# Patient Record
Sex: Male | Born: 1965 | Race: White | Hispanic: No | Smoking: Current every day smoker
Health system: Southern US, Community
[De-identification: ages and names within clinical notes are randomized; demographics above are authoritative.]

## PROBLEM LIST (undated history)

## (undated) DIAGNOSIS — L309 Dermatitis, unspecified: Secondary | ICD-10-CM

## (undated) DIAGNOSIS — F1010 Alcohol abuse, uncomplicated: Secondary | ICD-10-CM

## (undated) HISTORY — PX: NO PAST SURGERIES: SHX2092

---

## 2016-04-26 ENCOUNTER — Emergency Department
Admission: EM | Admit: 2016-04-26 | Discharge: 2016-04-27 | Disposition: A | Discharge status: Home / Self Care | Payer: Self-pay | Attending: Emergency Medicine | Admitting: Emergency Medicine

## 2016-04-26 ENCOUNTER — Encounter: Payer: Self-pay | Admitting: Emergency Medicine

## 2016-04-26 DIAGNOSIS — R1011 Right upper quadrant pain: Secondary | ICD-10-CM | POA: Insufficient documentation

## 2016-04-26 DIAGNOSIS — F17210 Nicotine dependence, cigarettes, uncomplicated: Secondary | ICD-10-CM | POA: Insufficient documentation

## 2016-04-26 DIAGNOSIS — R1010 Upper abdominal pain, unspecified: Secondary | ICD-10-CM

## 2016-04-26 DIAGNOSIS — F1020 Alcohol dependence, uncomplicated: Secondary | ICD-10-CM

## 2016-04-26 HISTORY — DX: Alcohol abuse, uncomplicated: F10.10

## 2016-04-26 LAB — CBC WITH DIFFERENTIAL/PLATELET
BASOS PCT: 1 %
Basophils Absolute: 0 10*3/uL (ref 0–0.1)
EOS ABS: 0.2 10*3/uL (ref 0–0.7)
EOS PCT: 2 %
HCT: 45.6 % (ref 40.0–52.0)
Hemoglobin: 15.9 g/dL (ref 13.0–18.0)
LYMPHS ABS: 3.5 10*3/uL (ref 1.0–3.6)
Lymphocytes Relative: 40 %
MCH: 31.6 pg (ref 26.0–34.0)
MCHC: 34.8 g/dL (ref 32.0–36.0)
MCV: 90.7 fL (ref 80.0–100.0)
MONOS PCT: 13 %
Monocytes Absolute: 1.1 10*3/uL — ABNORMAL HIGH (ref 0.2–1.0)
NEUTROS PCT: 44 %
Neutro Abs: 3.9 10*3/uL (ref 1.4–6.5)
PLATELETS: 228 10*3/uL (ref 150–440)
RBC: 5.03 MIL/uL (ref 4.40–5.90)
RDW: 13.1 % (ref 11.5–14.5)
WBC: 8.7 10*3/uL (ref 3.8–10.6)

## 2016-04-26 LAB — COMPREHENSIVE METABOLIC PANEL
ALBUMIN: 4 g/dL (ref 3.5–5.0)
ALT: 25 U/L (ref 17–63)
ANION GAP: 8 (ref 5–15)
AST: 24 U/L (ref 15–41)
Alkaline Phosphatase: 64 U/L (ref 38–126)
BUN: 8 mg/dL (ref 6–20)
CHLORIDE: 105 mmol/L (ref 101–111)
CO2: 28 mmol/L (ref 22–32)
Calcium: 8.9 mg/dL (ref 8.9–10.3)
Creatinine, Ser: 0.93 mg/dL (ref 0.61–1.24)
GFR calc non Af Amer: >60 mL/min (ref >60)
GLUCOSE: 125 mg/dL — AB (ref 65–99)
Potassium: 3.6 mmol/L (ref 3.5–5.1)
SODIUM: 141 mmol/L (ref 135–145)
Total Bilirubin: 0.7 mg/dL (ref 0.3–1.2)
Total Protein: 7.1 g/dL (ref 6.5–8.1)

## 2016-04-26 LAB — URINE DRUG SCREEN, QUALITATIVE (ARMC ONLY)
Amphetamines, Ur Screen: NOT DETECTED
BARBITURATES, UR SCREEN: NOT DETECTED
BENZODIAZEPINE, UR SCRN: NOT DETECTED
CANNABINOID 50 NG, UR ~~LOC~~: NOT DETECTED
COCAINE METABOLITE, UR ~~LOC~~: NOT DETECTED
MDMA (Ecstasy)Ur Screen: NOT DETECTED
Methadone Scn, Ur: NOT DETECTED
OPIATE, UR SCREEN: NOT DETECTED
PHENCYCLIDINE (PCP) UR S: NOT DETECTED
TRICYCLIC, UR SCREEN: NOT DETECTED

## 2016-04-26 LAB — ETHANOL: ALCOHOL ETHYL (B): 216 mg/dL — AB (ref <5)

## 2016-04-26 LAB — LIPASE, BLOOD: Lipase: 56 U/L — ABNORMAL HIGH (ref 11–51)

## 2016-04-26 MED ORDER — VITAMIN B-1 100 MG PO TABS
100.0000 mg | ORAL_TABLET | Freq: Every day | ORAL | Status: DC
  Administered 2016-04-26: 100 mg via ORAL
  Filled 2016-04-26 (×2): qty 1

## 2016-04-26 MED ORDER — LORAZEPAM 2 MG PO TABS: 0.0000 mg | ORAL_TABLET | Freq: Two times a day (BID) | ORAL | Status: DC

## 2016-04-26 MED ORDER — NICOTINE 21 MG/24HR TD PT24
21.0000 mg | MEDICATED_PATCH | Freq: Once | TRANSDERMAL | Status: DC
  Administered 2016-04-26: 21 mg via TRANSDERMAL
  Filled 2016-04-26: qty 1

## 2016-04-26 MED ORDER — NICOTINE 21 MG/24HR TD PT24
MEDICATED_PATCH | TRANSDERMAL | Status: AC
  Administered 2016-04-26: 21 mg via TRANSDERMAL
  Filled 2016-04-26: qty 1

## 2016-04-26 MED ORDER — LORAZEPAM 2 MG PO TABS
0.0000 mg | ORAL_TABLET | Freq: Four times a day (QID) | ORAL | Status: DC
  Administered 2016-04-26: 1 mg via ORAL
  Filled 2016-04-26: qty 1

## 2016-04-26 MED ORDER — THIAMINE HCL 100 MG/ML IJ SOLN: 100.0000 mg | Freq: Every day | INTRAMUSCULAR | Status: DC

## 2016-04-26 NOTE — ED Notes (Signed)
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 Pt is tearful and states that he drove himself here reluctantly because he knew he was impaired and shouldn't have been driving but wanted help so bad. Pt states he has been sober in the past for 4 years but states that it was 13 years ago. Pt doesn't exhibit any signs of dt's at this time

## 2016-04-26 NOTE — ED Notes (Signed)
Pt asleep, unable to complete CIWA

## 2016-04-26 NOTE — ED Provider Notes (Signed)
[facility] Emergency Department Provider Note   ____________________________________________   First MD Initiated Contact with Patient 04/26/16 1933     (approximate)  I have reviewed the triage vital signs and the nursing notes.   HISTORY  Chief Complaint Alcohol Problem    HPI Donald Dillon is a 50 y.o. male with a history of alcohol abuse who is presenting to the emergency department today requesting help with detox. He says that he drinks about a liter of vodka a day in addition to 1 coolers. He says that he is feeling sick whenever he stops drinking with shakes. He denies any history of seizures. Says that he was clean for a time several years ago and is wishing to be clean again. He says that alcoholism killed his father. Does not report any other drug use.   Past Medical History:  Diagnosis Date  . Alcohol abuse     There are no active problems to display for this patient.   History reviewed. No pertinent surgical history.  Prior to Admission medications   Not on File    Allergies Review of patient's allergies indicates no known allergies.  No family history on file.  Social History Social History  Substance Use Topics  . Smoking status: Current Every Day Smoker  . Smokeless tobacco: Never Used  . Alcohol use Not on file    Review of Systems Constitutional: No fever/chills Eyes: No visual changes. ENT: No sore throat. Cardiovascular: Denies chest pain. Respiratory: Denies shortness of breath. Gastrointestinal: No abdominal pain.  No nausea, no vomiting.  No diarrhea.  No constipation. Genitourinary: Negative for dysuria. Musculoskeletal: Negative for back pain. Skin: Negative for rash. Neurological: Negative for headaches, focal weakness or numbness.  10-point ROS otherwise negative.  ____________________________________________   PHYSICAL EXAM:  VITAL SIGNS: ED Triage Vitals  Enc Vitals Group     BP 04/26/16  1830 139/87     Pulse Rate 04/26/16 1830 (!) 106     Resp 04/26/16 1830 20     Temp 04/26/16 1830 98.7 F (37.1 C)     Temp Source 04/26/16 1830 Oral     SpO2 04/26/16 1830 97 %     Weight 04/26/16 1831 190 lb (86.2 kg)     Height 04/26/16 1831 5\' 10"  (1.778 m)     Head Circumference --      Peak Flow --      Pain Score 04/26/16 1831 0     Pain Loc --      Pain Edu? --      Excl. in GC? --     Constitutional: Alert and oriented. Well appearing and in no acute distress. Eyes: Conjunctivae are normal. PERRL. EOMI. Head: Atraumatic. Nose: No congestion/rhinnorhea. Mouth/Throat: Mucous membranes are moist.  Oropharynx non-erythematous. Neck: No stridor.   Cardiovascular: Normal rate, regular rhythm. Grossly normal heart sounds.  Good peripheral circulation. Respiratory: Normal respiratory effort.  No retractions. Lungs CTAB. Gastrointestinal: Soft With mild epigastric and right upper quadrant tenderness. Negative Murphy sign. No distention.  Musculoskeletal: No lower extremity tenderness nor edema.  No joint effusions. Neurologic:  Normal speech and language. No gross focal neurologic deficits are appreciated. No tremulousness. Skin:  Skin is warm, dry and intact. No rash noted. Psychiatric: Mood and affect are normal. Speech and behavior are normal.  ____________________________________________   LABS (all labs ordered are listed, but only abnormal results are displayed)  Labs Reviewed  CBC WITH DIFFERENTIAL/PLATELET - Abnormal; Notable for the  following:       Result Value   Monocytes Absolute 1.1 (*)    All other components within normal limits  COMPREHENSIVE METABOLIC PANEL - Abnormal; Notable for the following:    Glucose, Bld 125 (*)    All other components within normal limits  LIPASE, BLOOD - Abnormal; Notable for the following:    Lipase 56 (*)    All other components within normal limits  ETHANOL - Abnormal; Notable for the following:    Alcohol, Ethyl (B) 216  (*)    All other components within normal limits  URINE DRUG SCREEN, QUALITATIVE ([facility] ONLY)  ETHANOL   ____________________________________________  EKG   ____________________________________________  RADIOLOGY   ____________________________________________   PROCEDURES  Procedure(s) performed:   Procedures  Critical Care performed:   ____________________________________________   INITIAL IMPRESSION / ASSESSMENT AND PLAN / ED COURSE  Pertinent labs & imaging results that were available during my care of the patient were reviewed by me and considered in my medical decision making (see chart for details).  Patient to be evaluated by the behavioral health intake. He is requesting inpatient treatment.  Clinical Course   ----------------------------------------- 11pm, 04/26/2016 ----------------------------------------- Patient evaluated by the [facility] intake. At this time, the patient may be excepted to [facility] but must have a lower alcohol level. A repeat alcohol level was drawn. Mild elevation in the patient's lipase. However, no vomiting. Mild tenderness palpation. Unlikely to be acute pancreatitis. If it is in fact per dose and is a very mild case and I believe can be treated on an outpatient basis.   ____________________________________________   FINAL CLINICAL IMPRESSION(S) / ED DIAGNOSES  Alcholism.  Upper abdominal pain.      NEW MEDICATIONS STARTED DURING THIS VISIT:  New Prescriptions   No medications on file     Note:  This document was prepared using Dragon voice recognition software and may include unintentional dictation errors.    Myrna Blazeravid Matthew Gracelin Weisberg, MD 04/27/16 [phone]

## 2016-04-26 NOTE — ED Triage Notes (Signed)
States he needs help with alcohol detox.  States he has been drinking for days, drinks mikes hard lemonade in the mornings and vodka at night.

## 2016-04-26 NOTE — BH Assessment (Signed)
Assessment Note  Donald PalingRoger E Dunbar is an 50 y.o. male. Patient was brought into the ED by friends because of "I drank to much".  Patient is requesting assistance with detox.  Patient reports drinking daily a fifth of vodka and started drinking at age 50. He reports the longest period of sobriety was 4 years from 2006-2010.  Patient reports a history of multiple inpatient treatment program but he is unable to recall the details.  He reports when he was sober the participation with AA helped him with staying clean.  Patient report a history of withdrawal symptoms such as sweats, anxiety, shakes, nausea, vomiting, diarrhea, numbness, and sensitivity to light/noise.  Patient denies a past history of delirium or seizures.   Patient denies SI/HI, A/VH, and other self-injurious behaviors.    This Clinical research associatewriter consulted with Dr. Langston MaskerShaevitz it is recommended to refer for inpatient detox.    Diagnosis: Alcohol use, severe  Past Medical History:  Past Medical History:  Diagnosis Date  . Alcohol abuse     History reviewed. No pertinent surgical history.  Family History: No family history on file.  Social History:  reports that he has been smoking.  He has never used smokeless tobacco. His alcohol and drug histories are not on file.  Additional Social History:  Alcohol / Drug Use Pain Medications: see chart Prescriptions: see chart Over the Counter: see chart History of alcohol / drug use?: Yes Longest period of sobriety (when/how long): 4 years 2006-2010 Negative Consequences of Use: Personal relationships, Surveyor, quantityinancial, Work / Programmer, multimediachool Withdrawal Symptoms: Agitation, Weakness, Diarrhea, Sweats, Irritability, Nausea / Vomiting, Tremors Substance #1 Name of Substance 1: Alcohol 1 - Age of First Use: 14 1 - Amount (size/oz): fifth of vodka or more 1 - Frequency: daily 1 - Duration: ongoing 1 - Last Use / Amount: today  CIWA: CIWA-Ar BP: 139/87 Pulse Rate: (!) 106 Nausea and Vomiting: mild nausea with no  vomiting Tactile Disturbances: very mild itching, pins and needles, burning or numbness Tremor: not visible, but can be felt fingertip to fingertip Auditory Disturbances: not present Paroxysmal Sweats: no sweat visible Visual Disturbances: not present Anxiety: two Headache, Fullness in Head: none present Agitation: somewhat more than normal activity Orientation and Clouding of Sensorium: cannot do serial additions or is uncertain about date CIWA-Ar Total: 7 COWS:    Allergies: No Known Allergies  Home Medications:  (Not in a hospital admission)  OB/GYN Status:  No LMP for male patient.  General Assessment Data [facility] Assessment: In system Is this a Tele or Face-to-Face Assessment?: Face-to-Face Is this an Initial Assessment or a Re-assessment for this encounter?: Initial Assessment Marital status: Single Maiden name: na Is patient pregnant?: No Pregnancy Status: No Living Arrangements: Non-relatives/Friends Can pt return to current living arrangement?: Yes Admission Status: Voluntary Is patient capable of signing voluntary admission?: Yes Referral Source: Self/Family/Friend Insurance type: na  Medical Screening Exam [facility] Walk-in ONLY) Medical Exam completed: Yes  Crisis Care Plan Living Arrangements: Non-relatives/Friends Name of Psychiatrist: none Name of Therapist: none  Education Status Is patient currently in school?: No Current Grade: na Highest grade of school patient has completed: 12 Name of school: na Contact person: na  Risk to self with the past 6 months Suicidal Ideation: No-Not Currently/Within Last 6 Months Has patient been a risk to self within the past 6 months prior to admission? : No Suicidal Intent: No-Not Currently/Within Last 6 Months Has patient had any suicidal intent within the past 6  months prior to admission? : No Is patient at risk for suicide?: No Suicidal Plan?: No-Not Currently/Within Last 6  Months Has patient had any suicidal plan within the past 6 months prior to admission? : No Access to Means: No What has been your use of drugs/alcohol within the last 12 months?: alcohol Previous Attempts/Gestures: No How many times?: 0 Other Self Harm Risks: na Intentional Self Injurious Behavior: None Family Suicide History: No Recent stressful life event(s): Conflict (Comment), Loss (Comment), Financial Problems, Other (Comment) (SA) Persecutory voices/beliefs?: No Depression: Yes Depression Symptoms: Fatigue, Guilt, Loss of interest in usual pleasures, Feeling angry/irritable, Feeling worthless/self pity Substance abuse history and/or treatment for substance abuse?: Yes  Risk to Others within the past 6 months Homicidal Ideation: No-Not Currently/Within Last 6 Months Does patient have any lifetime risk of violence toward others beyond the six months prior to admission? : No Thoughts of Harm to Others: No-Not Currently Present/Within Last 6 Months Current Homicidal Intent: No-Not Currently/Within Last 6 Months Current Homicidal Plan: No-Not Currently/Within Last 6 Months Access to Homicidal Means: No Identified Victim: na History of harm to others?: No Assessment of Violence: None Noted Violent Behavior Description: none Does patient have access to weapons?: No Criminal Charges Pending?: No Does patient have a court date: No Is patient on probation?: No  Psychosis Hallucinations: None noted Delusions: None noted  Mental Status Report Appearance/Hygiene: Disheveled Eye Contact: Poor Motor Activity: Freedom of movement Speech: Logical/coherent Level of Consciousness: Sleeping, Restless Mood: Anxious Affect: Anxious Anxiety Level: Minimal Thought Processes: Relevant, Coherent Judgement: Unimpaired Orientation: Person, Place, Time, Situation, Appropriate for developmental age Obsessive Compulsive Thoughts/Behaviors: None  Cognitive Functioning Concentration:  Poor Memory: Remote Impaired, Recent Intact IQ: Average Insight: Fair Impulse Control: Fair Appetite: Poor Weight Loss: 0 Weight Gain: 0 Sleep: Decreased Total Hours of Sleep: 5 Vegetative Symptoms: None  ADLScreening [facility] Assessment Services) Patient's cognitive ability adequate to safely complete daily activities?: Yes Patient able to express need for assistance with ADLs?: Yes Independently performs ADLs?: Yes (appropriate for developmental age)  Prior Inpatient Therapy Prior Inpatient Therapy: Yes Prior Therapy Dates: unknown Prior Therapy Facilty/Provider(s): 1x in [place], [place], others unknown Reason for Treatment: Alcohol treatment  Prior Outpatient Therapy Prior Outpatient Therapy: Yes Prior Therapy Dates: unknown Prior Therapy Facilty/Provider(s): unkown Reason for Treatment: alcohol Does patient have an ACCT team?: No Does patient have Intensive In-House Services?  : No Does patient have [facility] services? : No Does patient have P4CC services?: No  ADL Screening (condition at time of admission) Patient's cognitive ability adequate to safely complete daily activities?: Yes Patient able to express need for assistance with ADLs?: Yes Independently performs ADLs?: Yes (appropriate for developmental age)       Abuse/Neglect Assessment (Assessment to be complete while patient is alone) Physical Abuse: Denies Verbal Abuse: Denies Sexual Abuse: Denies Exploitation of patient/patient's resources: Denies Self-Neglect: Denies Values / Beliefs Cultural Requests During Hospitalization: None Spiritual Requests During Hospitalization: None Consults Spiritual Care Consult Needed: No Social Work Consult Needed: No      Additional Information 1:1 In Past 12 Months?: No CIRT Risk: No Elopement Risk: No Does patient have medical clearance?: Yes     Disposition:  Disposition Initial Assessment Completed for this Encounter: Yes Disposition of Patient: Inpatient  treatment program Type of inpatient treatment program: Adult (Detox )  On Site Evaluation by:   Reviewed with Physician:    Maryelizabeth Rowan A 04/26/2016 9:53 PM

## 2016-04-26 NOTE — Progress Notes (Signed)
This Clinical research associatewriter spoke with Rosanne AshingJim, RN with [facility] who reports availability and to fax the referral.  This patient will need an updated BAC until under 200.     Maryelizabeth Rowanressa Kalie Cabral, MSW, Clare CharonLCSW, LCAS [facility] Triage Specialist [phone]

## 2016-04-27 LAB — ETHANOL: Alcohol, Ethyl (B): 170 mg/dL — ABNORMAL HIGH (ref <5)

## 2016-04-27 NOTE — ED Notes (Signed)
[facility] called, stated they will pick up pt at 4am

## 2016-04-27 NOTE — Progress Notes (Signed)
This clinician faxed referral to [facility] for review and provided oncoming clinical contact information for correspondence.      Maryelizabeth Rowanressa Yordin Rhoda, MSW, Clare CharonLCSW, LCAS [facility] Triage Specialist [phone]

## 2016-04-27 NOTE — Discharge Instructions (Signed)
Procedure directly to [facility]. Return to the ER for worsening symptoms, persistent vomiting, difficulty breathing or other concerns.

## 2016-04-27 NOTE — ED Provider Notes (Signed)
-----------------------------------------   3:20 AM on 04/27/2016 -----------------------------------------  Patient was accepted to [facility] who will transport patient shortly. Patient currently sleeping in no acute distress.   Irean HongJade J Lariya Kinzie, MD 04/27/16 [phone]

## 2016-04-27 NOTE — ED Notes (Signed)
Discharge instructions reviewed with patient. Patient verbalized understanding. Patient ambulated to lobby without difficulty and left with representative from [facility]

## 2016-04-27 NOTE — ED Notes (Signed)
Called [facility] to inquire when pt would be picked up, per [facility] they still need to receive demographics sheet from [facility], Clar[facility] CheMargaret, [facility] to [facility].  Next shift nurse to be informed to check in with [facility] in a little while [phone]

## 2017-04-09 ENCOUNTER — Encounter: Payer: Self-pay | Admitting: Emergency Medicine

## 2017-04-09 ENCOUNTER — Emergency Department: Payer: Self-pay

## 2017-04-09 ENCOUNTER — Emergency Department
Admission: EM | Admit: 2017-04-09 | Discharge: 2017-04-09 | Disposition: A | Discharge status: Home / Self Care | Payer: Self-pay | Attending: Emergency Medicine | Admitting: Emergency Medicine

## 2017-04-09 DIAGNOSIS — F17200 Nicotine dependence, unspecified, uncomplicated: Secondary | ICD-10-CM | POA: Insufficient documentation

## 2017-04-09 DIAGNOSIS — L309 Dermatitis, unspecified: Secondary | ICD-10-CM | POA: Insufficient documentation

## 2017-04-09 DIAGNOSIS — L0390 Cellulitis, unspecified: Secondary | ICD-10-CM | POA: Insufficient documentation

## 2017-04-09 HISTORY — DX: Dermatitis, unspecified: L30.9

## 2017-04-09 MED ORDER — TRIAMCINOLONE ACETONIDE 0.025 % EX OINT: 1.0000 "application " | TOPICAL_OINTMENT | Freq: Two times a day (BID) | CUTANEOUS | 0 refills | Status: DC

## 2017-04-09 MED ORDER — CLINDAMYCIN PHOSPHATE 600 MG/4ML IJ SOLN
600.0000 mg | Freq: Once | INTRAMUSCULAR | Status: AC
  Administered 2017-04-09: 600 mg via INTRAMUSCULAR
  Filled 2017-04-09: qty 4

## 2017-04-09 MED ORDER — METHYLPREDNISOLONE SODIUM SUCC 125 MG IJ SOLR
125.0000 mg | Freq: Once | INTRAMUSCULAR | Status: AC
  Administered 2017-04-09: 125 mg via INTRAMUSCULAR
  Filled 2017-04-09: qty 2

## 2017-04-09 MED ORDER — CLINDAMYCIN HCL 300 MG PO CAPS: 300.0000 mg | ORAL_CAPSULE | Freq: Three times a day (TID) | ORAL | 0 refills | Status: AC

## 2017-04-09 MED ORDER — PREDNISONE 10 MG (21) PO TBPK: ORAL_TABLET | ORAL | 0 refills | Status: DC

## 2017-04-09 NOTE — ED Notes (Signed)
Patient returned from Ultrasound. 

## 2017-04-09 NOTE — ED Notes (Signed)
Patient transported to Ultrasound 

## 2017-04-09 NOTE — ED Triage Notes (Signed)
Patient to ER for c/o rash to right lower leg up to back of knee. Patient states he has h/o eczema and developed area of eczema to right shin. States since then (a few days ago), he has new redness on back of leg with "small knot" to inner back of right knee. Patient admits to scratching both areas. Patient denies any fevers.

## 2017-04-09 NOTE — ED Provider Notes (Signed)
[facility] Emergency Department Provider Note  ____________________________________________  Time seen: Approximately 7:22 AM  I have reviewed the triage vital signs and the nursing notes.   HISTORY  Chief Complaint Rash    HPI Donald Dillon is a 51 y.o. male that presents to the emergency department with rash over right shin and "red knot" behind right right knee. Rash worsened 4 days ago and yesterday he noticed a knot behind his leg. Rash itches. He initially had a rash on both the right and left legs but left leg has improved. Patient developed eczema 4 years ago. He has been applying eczema cream to rash. He smokes a pack of cigarettes per day. No history of a blood clot or recent surgery. This has happened previously and patient took steroids with resolution of symptoms. No fever, chills, nausea, vomiting, abdominal pain.   Past Medical History:  Diagnosis Date  . Alcohol abuse   . Eczema     There are no active problems to display for this patient.   History reviewed. No pertinent surgical history.  Prior to Admission medications   Medication Sig Start Date End Date Taking? Authorizing Provider  clindamycin (CLEOCIN) 300 MG capsule Take 1 capsule (300 mg total) by mouth 3 (three) times daily. 04/09/17 04/19/17  Enid DerryWagner, Gracielynn Birkel, PA-C  predniSONE (STERAPRED UNI-PAK 21 TAB) 10 MG (21) TBPK tablet Take 6 tablets on day 1, take 5 tablets on day 2, take 4 tablets on day 3, take 3 tablets on day 4, take 2 tablets on day 5, take 1 tablet on day 6 04/09/17   Enid DerryWagner, Bibi Economos, PA-C  triamcinolone (KENALOG) 0.025 % ointment Apply 1 application topically 2 (two) times daily. 04/09/17   Enid DerryWagner, Margene Cherian, PA-C    Allergies Patient has no known allergies.  No family history on file.  Social History Social History  Substance Use Topics  . Smoking status: Current Every Day Smoker  . Smokeless tobacco: Never Used  . Alcohol use No     Review of Systems   Constitutional: No fever/chills ENT: No upper respiratory complaints. Cardiovascular: No chest pain. Respiratory: No SOB. Gastrointestinal: No abdominal pain.  No nausea, no vomiting.  Musculoskeletal: Positive for leg pain. Skin: Negative for abrasions, lacerations, ecchymosis. Positive for rash. Neurological: Negative for headaches, numbness or tingling   ____________________________________________   PHYSICAL EXAM:  VITAL SIGNS: ED Triage Vitals  Enc Vitals Group     BP 04/09/17 0652 119/72     Pulse Rate 04/09/17 0652 65     Resp 04/09/17 0652 18     Temp 04/09/17 0652 (!) 97.5 F (36.4 C)     Temp Source 04/09/17 0652 Oral     SpO2 04/09/17 0652 98 %     Weight 04/09/17 0652 188 lb (85.3 kg)     Height 04/09/17 0652 5\' 10"  (1.778 m)     Head Circumference --      Peak Flow --      Pain Score 04/09/17 0651 6     Pain Loc --      Pain Edu? --      Excl. in GC? --      Constitutional: Alert and oriented. Well appearing and in no acute distress. Eyes: Conjunctivae are normal. PERRL. EOMI. Head: Atraumatic. ENT:      Ears:      Nose: No congestion/rhinnorhea.      Mouth/Throat: Mucous membranes are moist.  Neck: No stridor. Cardiovascular: Normal rate, regular rhythm.  Good  peripheral circulation. Respiratory: Normal respiratory effort without tachypnea or retractions. Lungs CTAB. Good air entry to the bases with no decreased or absent breath sounds. Musculoskeletal: Full range of motion to all extremities. No gross deformities appreciated. Neurologic:  Normal speech and language. No gross focal neurologic deficits are appreciated.  Skin:  Skin is warm, dry and intact. No rash noted. 1/2 cm x 1/2 cm erythematous tender mass to right popliteal fossa. 4" x 3" patch of dry, scaly, red skin to right shin with 1 inch red streak extending up leg.  ____________________________________________   LABS (all labs ordered are listed, but only abnormal results are  displayed)  Labs Reviewed - No data to display ____________________________________________  EKG   ____________________________________________  RADIOLOGY Lexine Baton, personally viewed and evaluated these images (plain radiographs) as part of my medical decision making, as well as reviewing the written report by the radiologist.  US Venous Img Lower Unilateral Right  Result Date: 04/09/2017 CLINICAL DATA:  Calf pain with focal area of redness and swelling. EXAM: Right LOWER EXTREMITY VENOUS DOPPLER ULTRASOUND TECHNIQUE: Gray-scale sonography with graded compression, as well as color Doppler and duplex ultrasound were performed to evaluate the lower extremity deep venous systems from the level of the common femoral vein and including the common femoral, femoral, profunda femoral, popliteal and calf veins including the posterior tibial, peroneal and gastrocnemius veins when visible. The superficial great saphenous vein was also interrogated. Spectral Doppler was utilized to evaluate flow at rest and with distal augmentation maneuvers in the common femoral, femoral and popliteal veins. COMPARISON:  None. FINDINGS: Contralateral Common Femoral Vein: Respiratory phasicity is normal and symmetric with the symptomatic side. No evidence of thrombus. Normal compressibility. Common Femoral Vein: No evidence of thrombus. Normal compressibility, respiratory phasicity and response to augmentation. Saphenofemoral Junction: No evidence of thrombus. Normal compressibility and flow on color Doppler imaging. Profunda Femoral Vein: No evidence of thrombus. Normal compressibility and flow on color Doppler imaging. Femoral Vein: No evidence of thrombus. Normal compressibility, respiratory phasicity and response to augmentation. Popliteal Vein: No evidence of thrombus. Normal compressibility, respiratory phasicity and response to augmentation. Calf Veins: No evidence of thrombus. Normal compressibility and flow on  color Doppler imaging. Superficial Great Saphenous Vein: No evidence of thrombus. Normal compressibility and flow on color Doppler imaging. Venous Reflux:  None. Other Findings: Scanning in the region of concern in the medial lower leg does not show any associated venous pathology. No fluid collection or other specific sonographic finding. IMPRESSION: No evidence of DVT within the right lower extremity. No specific finding in the region of redness.  No fluid collection. Electronically Signed   By: Paulina Fusi M.D.   On: 04/09/2017 08:57    ____________________________________________    PROCEDURES  Procedure(s) performed:    Procedures    Medications  clindamycin (CLEOCIN) injection 600 mg (600 mg Intramuscular Given 04/09/17 0939)  methylPREDNISolone sodium succinate (SOLU-MEDROL) 125 mg/2 mL injection 125 mg (125 mg Intramuscular Given 04/09/17 0940)     ____________________________________________   INITIAL IMPRESSION / ASSESSMENT AND PLAN / ED COURSE  Pertinent labs & imaging results that were available during my care of the patient were reviewed by me and considered in my medical decision making (see chart for details).  Review of the [place] CSRS was performed in accordance of the [facility] prior to dispensing any controlled drugs.   Patient's diagnosis is consistent with eczema, cellulitis. Vital signs and exam are reassuring. Ultrasound negative for DVT. Patient was given clindamycin  and Solu-Medrol in ED. Patient will be discharged home with prescriptions for clindamycin, triamcinolone, and prednisone. Patient is to follow up with PCP as directed. Patient is given ED precautions to return to the ED for any worsening or new symptoms.     ____________________________________________  FINAL CLINICAL IMPRESSION(S) / ED DIAGNOSES  Final diagnoses:  Eczema, unspecified type  Cellulitis, unspecified cellulitis site      NEW MEDICATIONS STARTED DURING THIS VISIT:  Discharge  Medication List as of 04/09/2017  9:44 AM    START taking these medications   Details  clindamycin (CLEOCIN) 300 MG capsule Take 1 capsule (300 mg total) by mouth 3 (three) times daily., Starting Wed 04/09/2017, Until Sat 04/19/2017, Print    predniSONE (STERAPRED UNI-PAK 21 TAB) 10 MG (21) TBPK tablet Take 6 tablets on day 1, take 5 tablets on day 2, take 4 tablets on day 3, take 3 tablets on day 4, take 2 tablets on day 5, take 1 tablet on day 6, Print    triamcinolone (KENALOG) 0.025 % ointment Apply 1 application topically 2 (two) times daily., Starting Wed 04/09/2017, Print            This chart was dictated using voice recognition software/Dragon. Despite best efforts to proofread, errors can occur which can change the meaning. Any change was purely unintentional.    Enid Derry, PA-C 04/10/17 0759    Nita Sickle, MD 04/10/17 2322

## 2017-07-21 ENCOUNTER — Ambulatory Visit
Admission: EM | Admit: 2017-07-21 | Discharge: 2017-07-21 | Disposition: A | Discharge status: Home / Self Care | Payer: Self-pay | Attending: Family Medicine | Admitting: Family Medicine

## 2017-07-21 ENCOUNTER — Other Ambulatory Visit: Payer: Self-pay

## 2017-07-21 DIAGNOSIS — L03115 Cellulitis of right lower limb: Secondary | ICD-10-CM

## 2017-07-21 DIAGNOSIS — L309 Dermatitis, unspecified: Secondary | ICD-10-CM

## 2017-07-21 MED ORDER — DOXYCYCLINE HYCLATE 100 MG PO CAPS: 100.0000 mg | ORAL_CAPSULE | Freq: Two times a day (BID) | ORAL | 0 refills | Status: AC

## 2017-07-21 MED ORDER — TRIAMCINOLONE ACETONIDE 0.5 % EX OINT: 1.0000 "application " | TOPICAL_OINTMENT | Freq: Two times a day (BID) | CUTANEOUS | 0 refills | Status: AC

## 2017-07-21 NOTE — ED Provider Notes (Signed)
[facility]    CSN: [id number] Arrival date & time: 07/21/17  [id number]   History   Chief Complaint Chief Complaint  Patient presents with  . Recurrent Skin Infections   HPI   51 year old male with a history of eczema presents with concern for developing abscess.  Patient reports that his eczema has recently flared.  It is located on his anterior right lower leg.  He states that it is intensely itchy.  He has no medications for this at this time.  He is requesting medication today.  Additionally, over the past 2 days he has had a red, warm area above his right knee.  He believes it is infected.  No drainage.  He has been applying topical antibiotic ointment without resolution.  No fevers or chills.  No other associated symptoms.  No other complaints or concerns at this time.  Past Medical History:  Diagnosis Date  . Alcohol abuse   . Eczema    Past Surgical History:  Procedure Laterality Date  . NO PAST SURGERIES     Home Medications    Prior to Admission medications   Medication Sig Start Date End Date Taking? Authorizing Provider  doxycycline (VIBRAMYCIN) 100 MG capsule Take 1 capsule (100 mg total) by mouth 2 (two) times daily. 07/21/17   Tommie Samsook, Siyon Linck G, DO  triamcinolone ointment (KENALOG) 0.5 % Apply 1 application topically 2 (two) times daily. 07/21/17   Tommie Samsook, Evangelyne Loja G, DO   Family History Family History  Problem Relation Age of Onset  . Cancer Mother   . Liver disease Father    Social History Social History   Tobacco Use  . Smoking status: Current Every Day Smoker    Packs/day: 0.50  . Smokeless tobacco: Never Used  Substance Use Topics  . Alcohol use: No  . Drug use: No   Allergies   Patient has no known allergies.  Review of Systems Review of Systems  Constitutional: Negative.   Skin:       Skin infection. Eczema.  All other systems reviewed and are negative.  Physical Exam Triage Vital Signs ED Triage Vitals  Enc Vitals Group     BP  07/21/17 0835 123/79     Pulse Rate 07/21/17 0835 62     Resp 07/21/17 0835 17     Temp 07/21/17 0835 97.6 F (36.4 C)     Temp Source 07/21/17 0835 Oral     SpO2 07/21/17 0835 100 %     Weight 07/21/17 0832 195 lb (88.5 kg)     Height 07/21/17 0832 5\' 10"  (1.778 m)     Head Circumference --      Peak Flow --      Pain Score 07/21/17 0833 7     Pain Loc --      Pain Edu? --      Excl. in GC? --    Updated Vital Signs BP 123/79 (BP Location: Right Arm)   Pulse 62   Temp 97.6 F (36.4 C) (Oral)   Resp 17   Ht 5\' 10"  (1.778 m)   Wt 195 lb (88.5 kg)   SpO2 100%   BMI 27.98 kg/m   Physical Exam  Constitutional: He is oriented to person, place, and time. He appears well-developed. No distress.  HENT:  Head: Normocephalic and atraumatic.  Nose: Nose normal.  Eyes: Conjunctivae are normal. No scleral icterus.  Cardiovascular: Normal rate and regular rhythm.  2/6 systolic murmur.  Pulmonary/Chest: Effort normal and  breath sounds normal. He has no wheezes. He has no rales.  Abdominal: Soft. He exhibits no distension. There is no tenderness.  Musculoskeletal: Normal range of motion.  Neurological: He is alert and oriented to person, place, and time.  No apparent focal deficits.  Skin:  R anterior lower leg - dry, scaly area with evidence of excoriation.  R knee -patient with a 2 cm circumferential area of redness and tenderness above the knee.  Consistent with developing abscess.  Psychiatric: He has a normal mood and affect. His behavior is normal.  Nursing note and vitals reviewed.  UC Treatments / Results  Labs (all labs ordered are listed, but only abnormal results are displayed) Labs Reviewed - No data to display  EKG  EKG Interpretation None       Radiology No results found.  Procedures Procedures (including critical care time)  Medications Ordered in UC Medications - No data to display   Initial Impression / Assessment and Plan / UC Course  I have  reviewed the triage vital signs and the nursing notes.  Pertinent labs & imaging results that were available during my care of the patient were reviewed by me and considered in my medical decision making (see chart for details).     51 year old male presents with an eczema flare in addition to cellulitis/developing abscess.  Treating with doxycycline and triamcinolone ointment.  Final Clinical Impressions(s) / UC Diagnoses   Final diagnoses:  Cellulitis of right lower extremity  Eczema, unspecified type    ED Discharge Orders        Ordered    doxycycline (VIBRAMYCIN) 100 MG capsule  2 times daily     07/21/17 0850    triamcinolone ointment (KENALOG) 0.5 %  2 times daily     07/21/17 0850     Controlled Substance Prescriptions [place] Controlled Substance Registry consulted? Not Applicable   Tommie SamsCook, Doneta Bayman G, [place]DO 07/21/17 [phone]

## 2017-07-21 NOTE — ED Triage Notes (Signed)
Patient complains of boil on top of right knee that he thinks was caused by his eczema. Patient states that area came up around 3 days ago.

## 2017-07-21 NOTE — Discharge Instructions (Signed)
Antibiotic as prescribed.  Topical steroid as prescribed.  Take care  Dr. Adriana Simasook

## 2018-07-04 ENCOUNTER — Encounter: Payer: Self-pay | Admitting: Emergency Medicine

## 2018-07-04 ENCOUNTER — Emergency Department
Admission: EM | Admit: 2018-07-04 | Discharge: 2018-07-04 | Disposition: A | Discharge status: Home / Self Care | Payer: Self-pay | Attending: Emergency Medicine | Admitting: Emergency Medicine

## 2018-07-04 ENCOUNTER — Other Ambulatory Visit: Payer: Self-pay

## 2018-07-04 DIAGNOSIS — F17210 Nicotine dependence, cigarettes, uncomplicated: Secondary | ICD-10-CM | POA: Insufficient documentation

## 2018-07-04 DIAGNOSIS — L02416 Cutaneous abscess of left lower limb: Secondary | ICD-10-CM | POA: Insufficient documentation

## 2018-07-04 DIAGNOSIS — L03116 Cellulitis of left lower limb: Secondary | ICD-10-CM | POA: Insufficient documentation

## 2018-07-04 DIAGNOSIS — L0291 Cutaneous abscess, unspecified: Secondary | ICD-10-CM

## 2018-07-04 MED ORDER — CLINDAMYCIN HCL 300 MG PO CAPS: 300.0000 mg | ORAL_CAPSULE | Freq: Four times a day (QID) | ORAL | 0 refills | Status: AC

## 2018-07-04 MED ORDER — CLINDAMYCIN PHOSPHATE 300 MG/2ML IJ SOLN
600.0000 mg | Freq: Once | INTRAMUSCULAR | Status: AC
  Administered 2018-07-04: 600 mg via INTRAMUSCULAR
  Filled 2018-07-04: qty 4

## 2018-07-04 NOTE — ED Triage Notes (Signed)
Presents with redness and swelling to left knee  States he thinks he may have been stung by something on Monday  States he noticed that some pus came out of knee yesterday  Denies any pain but states area is stiff  Left knee is red  With min swelling noted on arrival

## 2018-07-04 NOTE — ED Provider Notes (Signed)
[facility] Emergency Department Provider Note  ____________________________________________  Time seen: Approximately 9:10 AM  I have reviewed the triage vital signs and the nursing notes.   HISTORY  Chief Complaint Insect Bite    HPI Donald Dillon is a 52 y.o. male presents emergency department for evaluation of pain and swelling with yellow drainage to left knee for 3 days.  Patient states that he initially noticed the top of his left knee was red and swollen on Wednesday.  He does not recall being bitten by anything or any injury.  Yesterday he was able to squeeze yellow drainage from area.  Area has continued to drain today.  He is not having any difficulty moving his knee.  He denies any pain.  He has felt well.  Last tetanus shot was 3 years ago.  No fevers, chills.   Past Medical History:  Diagnosis Date  . Alcohol abuse   . Eczema     There are no active problems to display for this patient.   Past Surgical History:  Procedure Laterality Date  . NO PAST SURGERIES      Prior to Admission medications   Medication Sig Start Date End Date Taking? Authorizing Provider  clindamycin (CLEOCIN) 300 MG capsule Take 1 capsule (300 mg total) by mouth 4 (four) times daily for 10 days. 07/04/18 07/14/18  Enid Derry, PA-C  doxycycline (VIBRAMYCIN) 100 MG capsule Take 1 capsule (100 mg total) by mouth 2 (two) times daily. 07/21/17   Tommie Sams, DO  triamcinolone ointment (KENALOG) 0.5 % Apply 1 application topically 2 (two) times daily. 07/21/17   Tommie Sams, DO    Allergies Patient has no known allergies.  Family History  Problem Relation Age of Onset  . Cancer Mother   . Liver disease Father     Social History Social History   Tobacco Use  . Smoking status: Current Every Day Smoker    Packs/day: 0.50  . Smokeless tobacco: Never Used  Substance Use Topics  . Alcohol use: No  . Drug use: No     Review of Systems   Constitutional: No fever/chills Gastrointestinal: No nausea, no vomiting.  Musculoskeletal: Negative for musculoskeletal pain. Skin: Negative for  abrasions, lacerations, ecchymosis.  Positive for rash. Neurological: Negative for numbness or tingling   ____________________________________________   PHYSICAL EXAM:  VITAL SIGNS: ED Triage Vitals  Enc Vitals Group     BP 07/04/18 0836 111/71     Pulse Rate 07/04/18 0836 82     Resp 07/04/18 0836 20     Temp 07/04/18 0836 98 F (36.7 C)     Temp Source 07/04/18 0836 Oral     SpO2 07/04/18 0836 97 %     Weight 07/04/18 0837 198 lb (89.8 kg)     Height 07/04/18 0837 5\' 10"  (1.778 m)     Head Circumference --      Peak Flow --      Pain Score 07/04/18 0836 0     Pain Loc --      Pain Edu? --      Excl. in GC? --      Constitutional: Alert and oriented. Well appearing and in no acute distress. Eyes: Conjunctivae are normal. PERRL. EOMI. Head: Atraumatic. ENT:      Ears:      Nose: No congestion/rhinnorhea.      Mouth/Throat: Mucous membranes are moist.  Neck: No stridor.  Cardiovascular: Normal rate, regular rhythm.  Good  peripheral circulation. Respiratory: Normal respiratory effort without tachypnea or retractions. Lungs CTAB. Good air entry to the bases with no decreased or absent breath sounds. Musculoskeletal: Full range of motion to all extremities. No gross deformities appreciated. Neurologic:  Normal speech and language. No gross focal neurologic deficits are appreciated.  Skin:  Skin is warm, dry.  1/2 cm x 1/2 cm area of swelling and fluctuance with purulent yellow drainage and 1 inch of surrounding erythema to left patella.  No pain with range of motion of the knee.  No tenderness to palpation.  No difficulty walking.  Normal gait. Psychiatric: Mood and affect are normal. Speech and behavior are normal. Patient exhibits appropriate insight and judgement.   ____________________________________________    LABS (all labs ordered are listed, but only abnormal results are displayed)  Labs Reviewed - No data to display ____________________________________________  EKG   ____________________________________________  RADIOLOGY   No results found.  ____________________________________________    PROCEDURES  Procedure(s) performed:    Procedures    Medications  clindamycin (CLEOCIN) injection 600 mg (600 mg Intramuscular Given 07/04/18 0904)     ____________________________________________   INITIAL IMPRESSION / ASSESSMENT AND PLAN / ED COURSE  Pertinent labs & imaging results that were available during my care of the patient were reviewed by me and considered in my medical decision making (see chart for details).  Review of the [place] CSRS was performed in accordance of the [facility] prior to dispensing any controlled drugs.     Patient's diagnosis is consistent with abscess and cellulitis.  Abscess is actively draining.  No indication of septic joint.  IM clindamycin was given.  Tetanus is up-to-date.  Patient will be discharged home with prescriptions for clindamycin. Patient is to follow up with primary care as directed. Patient is given ED precautions to return to the ED for any worsening or new symptoms.     ____________________________________________  FINAL CLINICAL IMPRESSION(S) / ED DIAGNOSES  Final diagnoses:  Abscess  Cellulitis of left lower extremity      NEW MEDICATIONS STARTED DURING THIS VISIT:  ED Discharge Orders         Ordered    clindamycin (CLEOCIN) 300 MG capsule  4 times daily     07/04/18 0914              This chart was dictated using voice recognition software/Dragon. Despite best efforts to proofread, errors can occur which can change the meaning. Any change was purely unintentional.    Enid DerryWagner, Jacquelynn Friend, PA-C 07/04/18 1333    Governor RooksLord, Rebecca, MD 07/04/18 [phone]

## 2018-07-04 NOTE — Discharge Instructions (Signed)
Please return to the emergency department immediately for fever, chills, increasing area of redness, pain with moving left knee, worsening symptoms.

## 2018-09-29 IMAGING — US US EXTREM LOW VENOUS*R*
1 series · 13 of 24 positions shown · non-contrast
Comparison: None.

CLINICAL DATA: Calf pain with focal area of redness and swelling.



[Series 1: us extrem low venous*right* · 0.08mm/px · 13 of 40 slices shown]
[im 1/40]
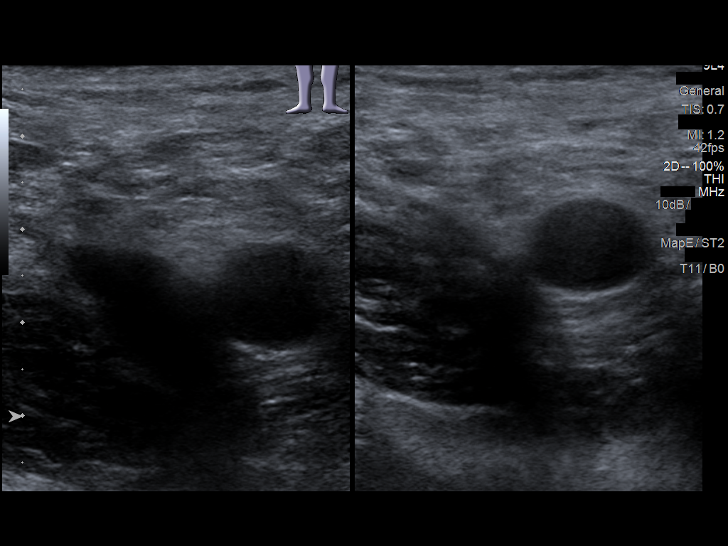
[im 4/40]
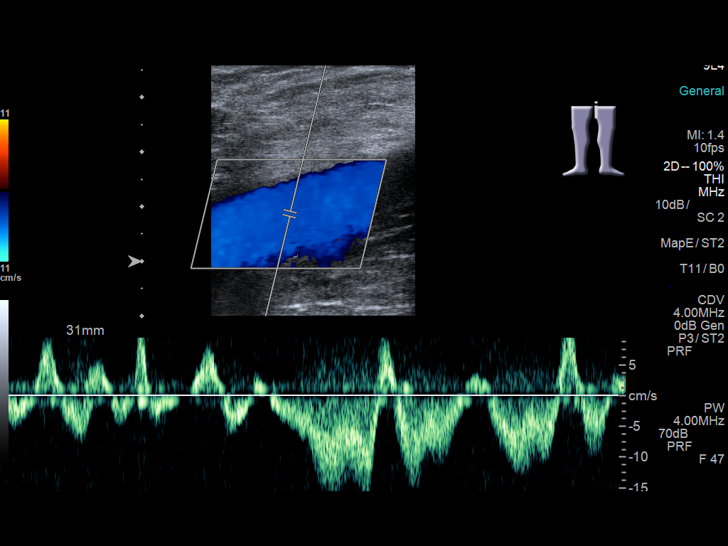
[im 7/40]
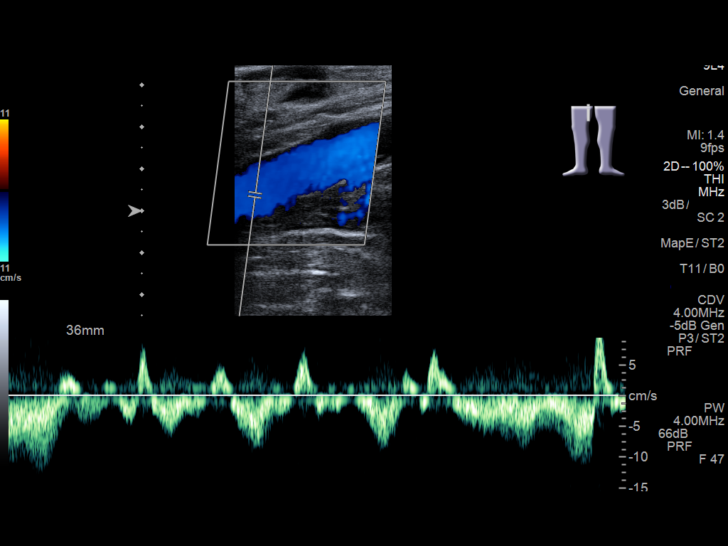
[im 11/40]
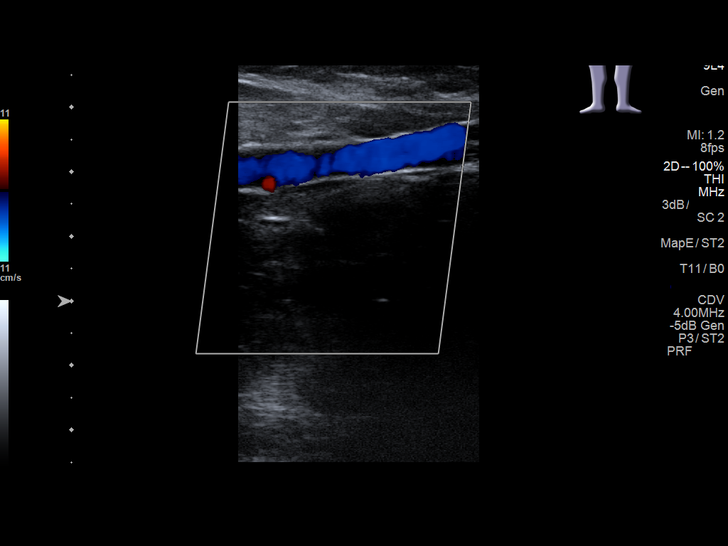
[im 14/40]
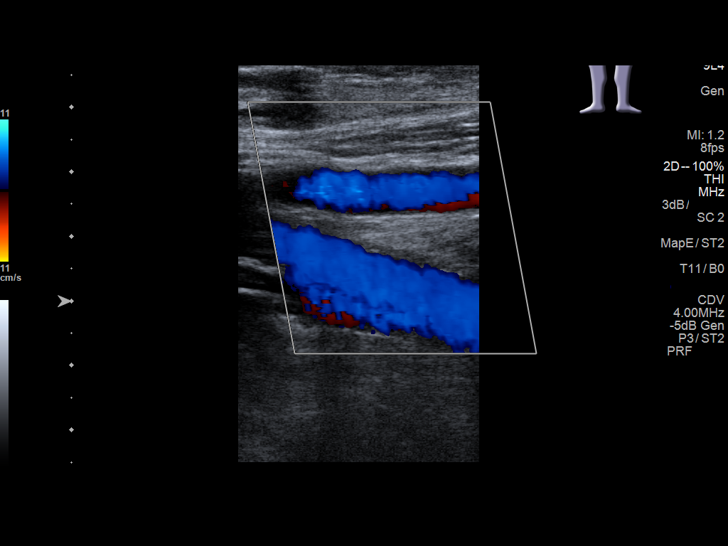
[im 17/40]
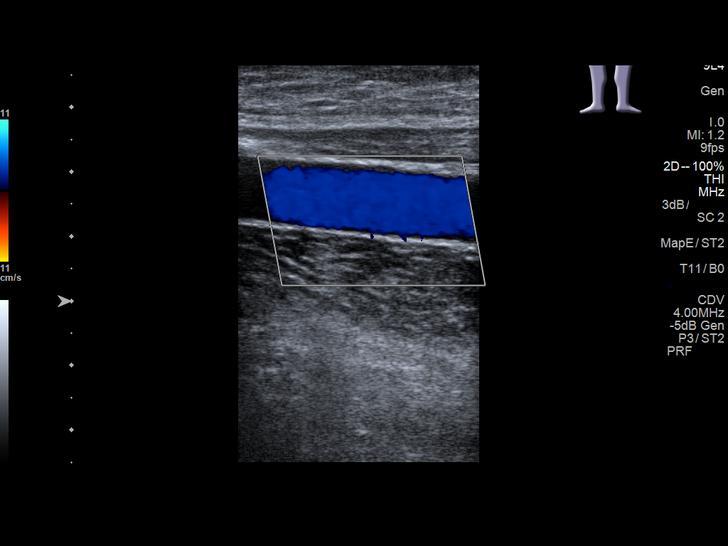
[im 21/40]
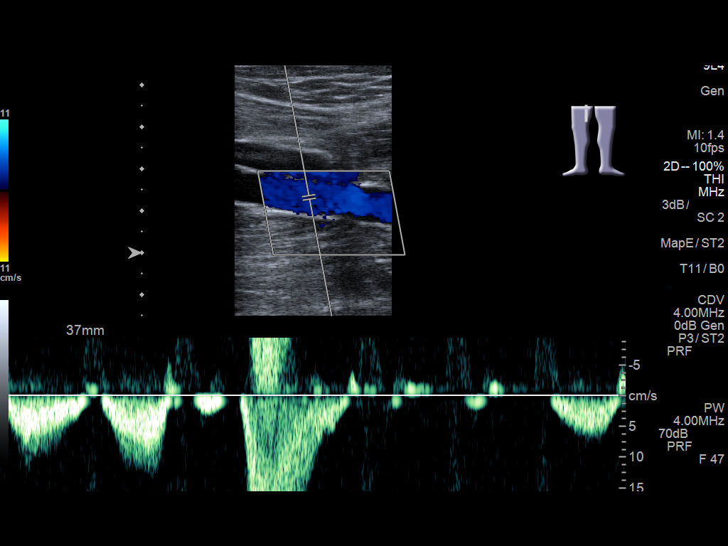
[im 23/40]
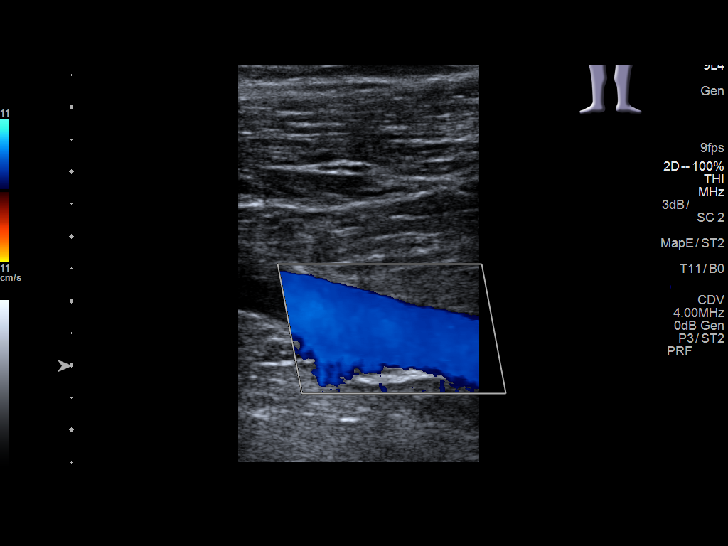
[im 26/40]
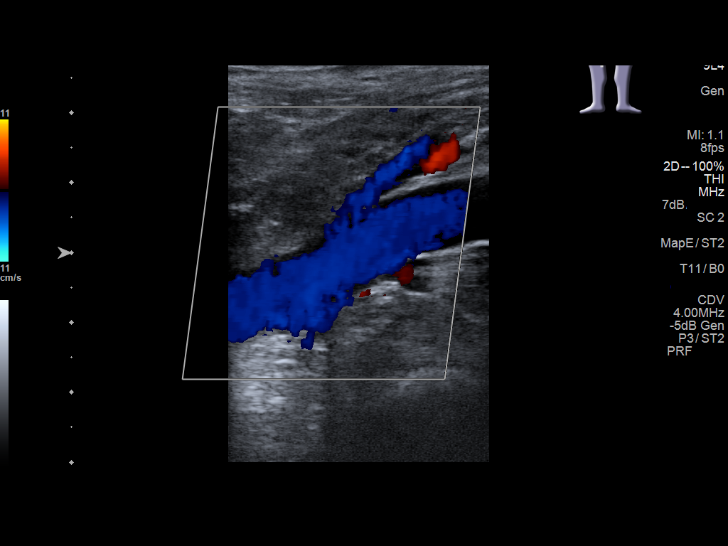
[im 29/40]
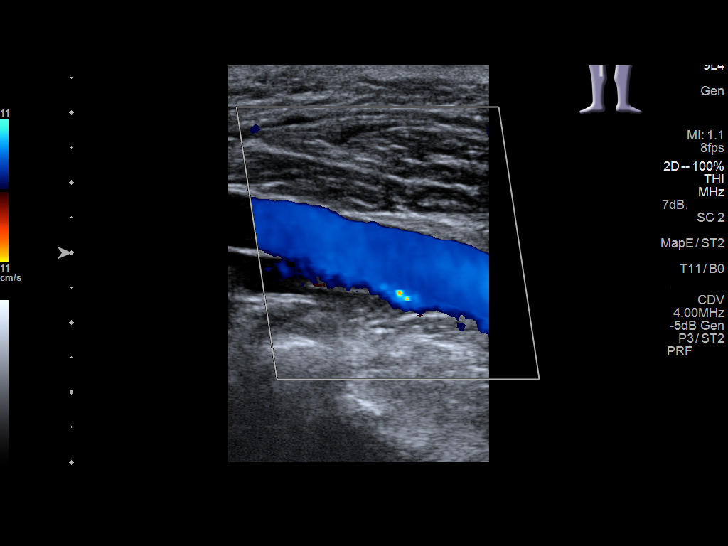
[im 33/40]
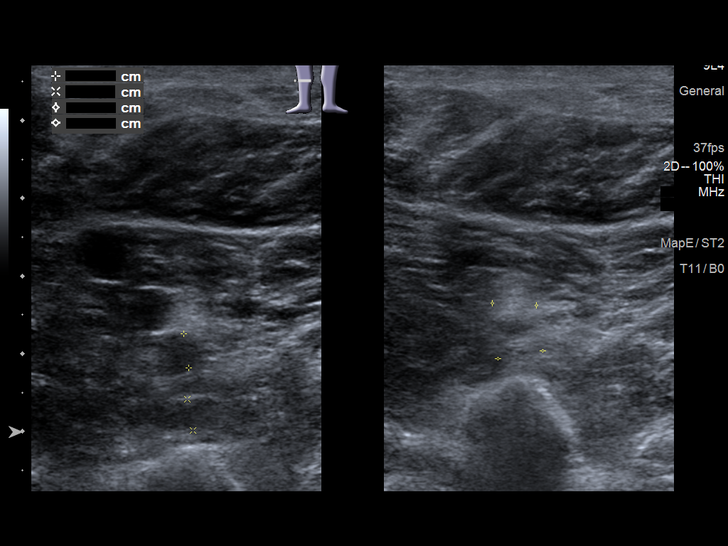
[im 36/40]
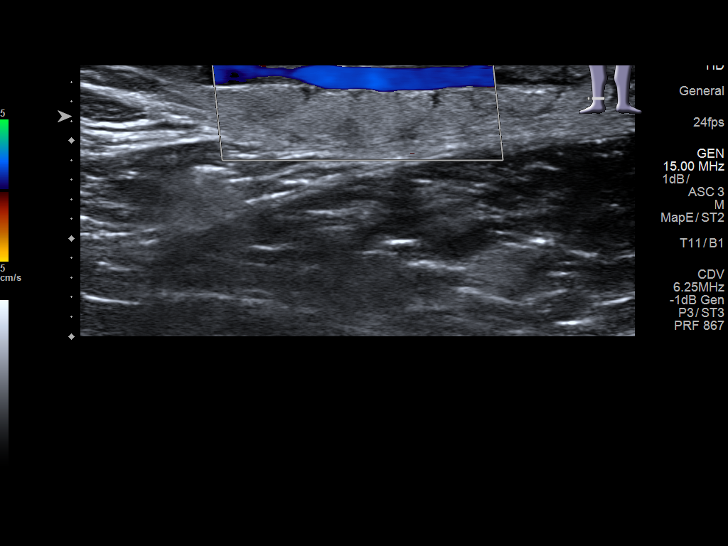
[im 40/40]
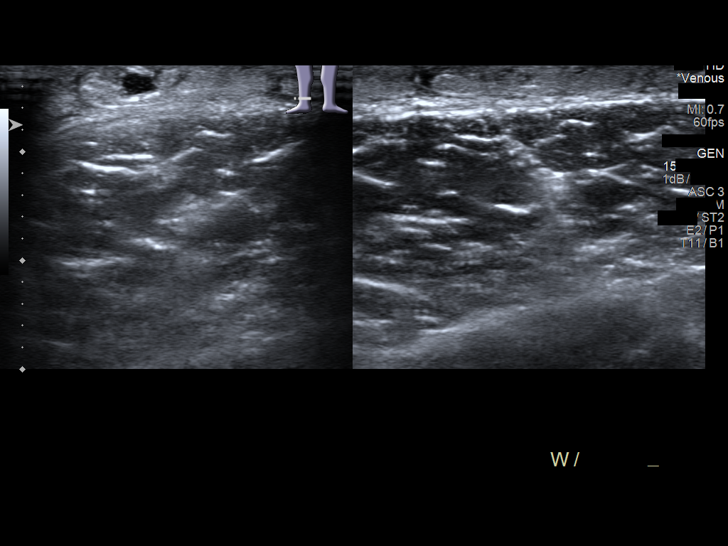

[13 of 24 positions shown; findings below may reference images not displayed]

FINDINGS: Contralateral Common Femoral Vein: Respiratory phasicity is normal
and symmetric with the symptomatic side. No evidence of thrombus.
Normal compressibility.

Common Femoral Vein: No evidence of thrombus. Normal
compressibility, respiratory phasicity and response to augmentation.

Saphenofemoral Junction: No evidence of thrombus. Normal
compressibility and flow on color Doppler imaging.

Profunda Femoral Vein: No evidence of thrombus. Normal
compressibility and flow on color Doppler imaging.

Femoral Vein: No evidence of thrombus. Normal compressibility,
respiratory phasicity and response to augmentation.

Popliteal Vein: No evidence of thrombus. Normal compressibility,
respiratory phasicity and response to augmentation.

Calf Veins: No evidence of thrombus. Normal compressibility and flow
on color Doppler imaging.

Superficial Great Saphenous Vein: No evidence of thrombus. Normal
compressibility and flow on color Doppler imaging.

Venous Reflux:  None.

Other Findings: Scanning in the region of concern in the medial
lower leg does not show any associated venous pathology. No fluid
collection or other specific sonographic finding.
IMPRESSION: No evidence of DVT within the right lower extremity.

No specific finding in the region of redness.  No fluid collection.

## 2021-03-01 ENCOUNTER — Other Ambulatory Visit: Payer: Self-pay

## 2021-03-01 ENCOUNTER — Emergency Department (HOSPITAL_BASED_OUTPATIENT_CLINIC_OR_DEPARTMENT_OTHER): Payer: 59

## 2021-03-01 ENCOUNTER — Emergency Department (HOSPITAL_BASED_OUTPATIENT_CLINIC_OR_DEPARTMENT_OTHER)
Admission: EM | Admit: 2021-03-01 | Discharge: 2021-03-01 | Disposition: A | Discharge status: Home / Self Care | Payer: 59 | Attending: Emergency Medicine | Admitting: Emergency Medicine

## 2021-03-01 ENCOUNTER — Encounter (HOSPITAL_BASED_OUTPATIENT_CLINIC_OR_DEPARTMENT_OTHER): Payer: Self-pay | Admitting: *Deleted

## 2021-03-01 DIAGNOSIS — R600 Localized edema: Secondary | ICD-10-CM | POA: Insufficient documentation

## 2021-03-01 DIAGNOSIS — F17210 Nicotine dependence, cigarettes, uncomplicated: Secondary | ICD-10-CM | POA: Insufficient documentation

## 2021-03-01 DIAGNOSIS — R2242 Localized swelling, mass and lump, left lower limb: Secondary | ICD-10-CM | POA: Diagnosis present

## 2021-03-01 DIAGNOSIS — M7989 Other specified soft tissue disorders: Secondary | ICD-10-CM

## 2021-03-01 LAB — CBC WITH DIFFERENTIAL/PLATELET
Abs Immature Granulocytes: 0.03 10*3/uL (ref 0.00–0.07)
Basophils Absolute: 0 10*3/uL (ref 0.0–0.1)
Basophils Relative: 0 %
Eosinophils Absolute: 0.2 10*3/uL (ref 0.0–0.5)
Eosinophils Relative: 2 %
HCT: 40.6 % (ref 39.0–52.0)
Hemoglobin: 13.7 g/dL (ref 13.0–17.0)
Immature Granulocytes: 0 %
Lymphocytes Relative: 39 %
Lymphs Abs: 3 10*3/uL (ref 0.7–4.0)
MCH: 29 pg (ref 26.0–34.0)
MCHC: 33.7 g/dL (ref 30.0–36.0)
MCV: 85.8 fL (ref 80.0–100.0)
Monocytes Absolute: 1 10*3/uL (ref 0.1–1.0)
Monocytes Relative: 13 %
Neutro Abs: 3.5 10*3/uL (ref 1.7–7.7)
Neutrophils Relative %: 46 %
Platelets: 236 10*3/uL (ref 150–400)
RBC: 4.73 MIL/uL (ref 4.22–5.81)
RDW: 13.2 % (ref 11.5–15.5)
WBC: 7.7 10*3/uL (ref 4.0–10.5)
nRBC: 0 % (ref 0.0–0.2)

## 2021-03-01 LAB — COMPREHENSIVE METABOLIC PANEL
ALT: 28 U/L (ref 0–44)
AST: 35 U/L (ref 15–41)
Albumin: 3.8 g/dL (ref 3.5–5.0)
Alkaline Phosphatase: 82 U/L (ref 38–126)
Anion gap: 8 (ref 5–15)
BUN: 15 mg/dL (ref 6–20)
CO2: 27 mmol/L (ref 22–32)
Calcium: 9.3 mg/dL (ref 8.9–10.3)
Chloride: 102 mmol/L (ref 98–111)
Creatinine, Ser: 0.75 mg/dL (ref 0.61–1.24)
GFR, Estimated: >60 mL/min (ref >60)
Glucose, Bld: 111 mg/dL — ABNORMAL HIGH (ref 70–99)
Potassium: 4 mmol/L (ref 3.5–5.1)
Sodium: 137 mmol/L (ref 135–145)
Total Bilirubin: 0.3 mg/dL (ref 0.3–1.2)
Total Protein: 8 g/dL (ref 6.5–8.1)

## 2021-03-01 LAB — PROTIME-INR
INR: 1 (ref 0.8–1.2)
Prothrombin Time: 13.4 seconds (ref 11.4–15.2)

## 2021-03-01 MED ORDER — FUROSEMIDE 20 MG PO TABS: 20.0000 mg | ORAL_TABLET | Freq: Every day | ORAL | 0 refills | Status: AC

## 2021-03-01 MED ORDER — FUROSEMIDE 40 MG PO TABS
40.0000 mg | ORAL_TABLET | Freq: Once | ORAL | Status: AC
  Administered 2021-03-01: 40 mg via ORAL
  Filled 2021-03-01: qty 1

## 2021-03-01 NOTE — ED Notes (Signed)
Patient transported to Ultrasound 

## 2021-03-01 NOTE — ED Triage Notes (Signed)
C/o left lower leg pain and swelling x 3 weeks
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
## 2021-03-01 NOTE — Discharge Instructions (Addendum)
You have no blood clots in your leg right now.  Please take Lasix 20 mg for 4 days   Please keep your leg elevated and use compression stockings  See your doctor for follow-up  Return to ER if you have worse leg swelling, chest pain, shortness of breath

## 2022-10-14 IMAGING — US US EXTREM LOW VENOUS*L*
1 series · 14 of 24 positions shown · non-contrast
Comparison: None.

CLINICAL DATA: Left leg pain.

EXAM:
LEFT LOWER EXTREMITY VENOUS DOPPLER ULTRASOUND
TECHNIQUE: Gray-scale sonography with compression, as well as color and duplex
ultrasound, were performed to evaluate the deep venous system(s)
from the level of the common femoral vein through the popliteal and
proximal calf veins.

[Series 1: us extrem low venous*left* · 14 of 36 slices shown]
[im 1/36]
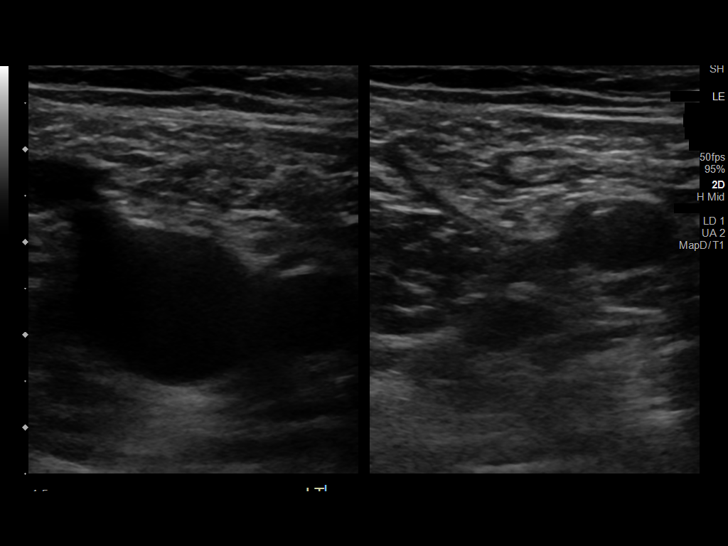
[im 4/36]
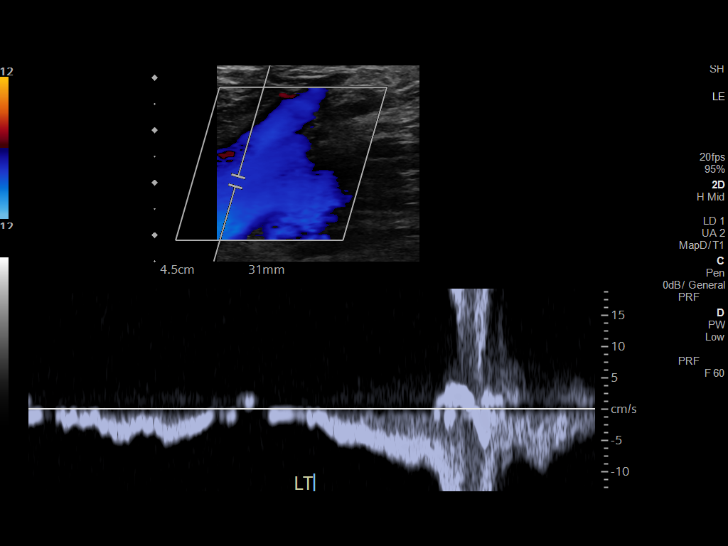
[im 7/36]
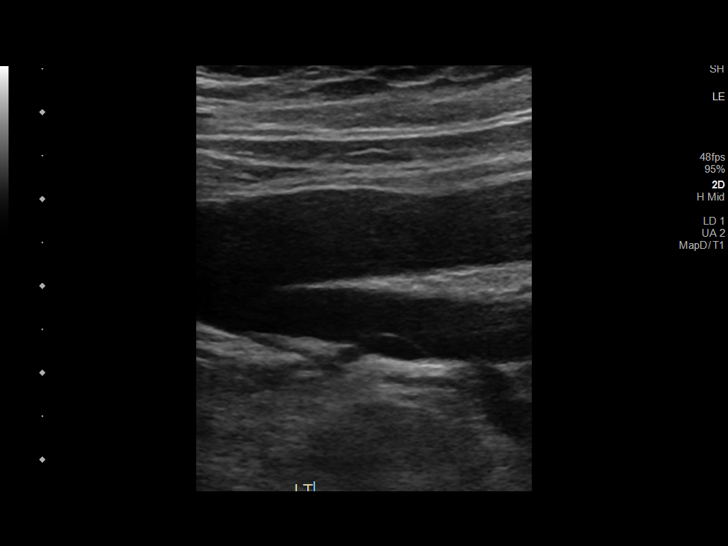
[im 10/36]
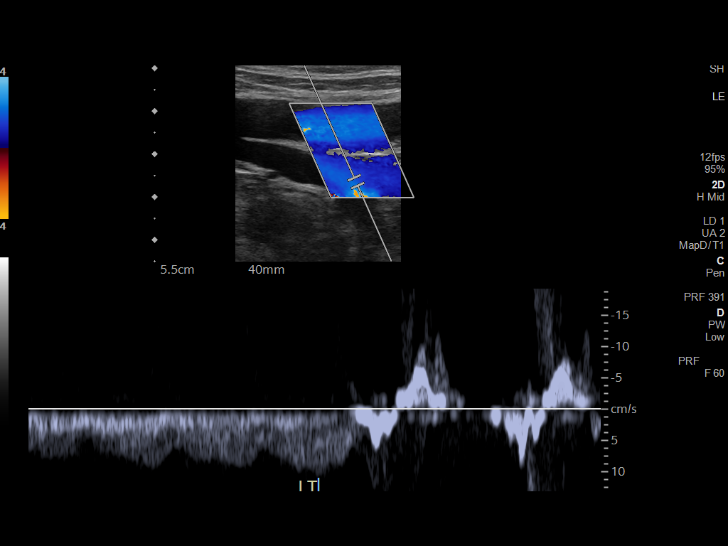
[im 11/36]
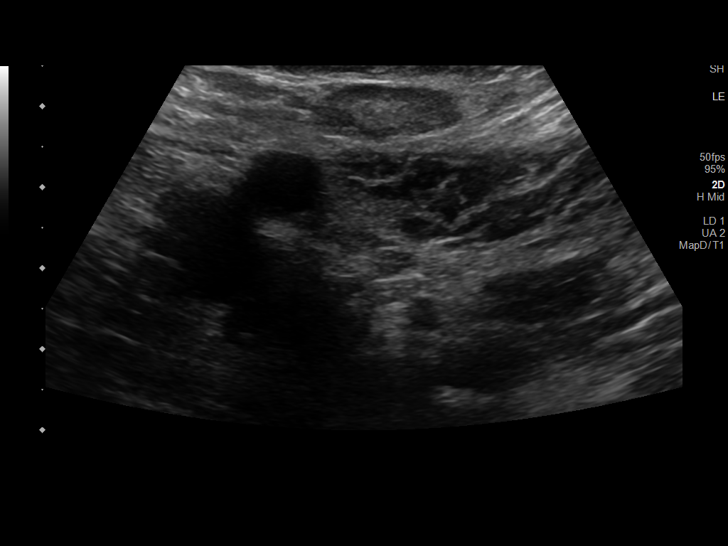
[im 14/36]
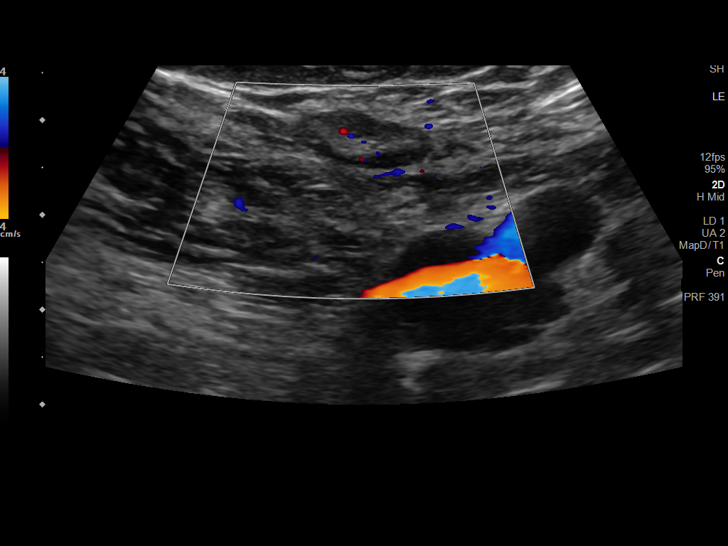
[im 17/36]
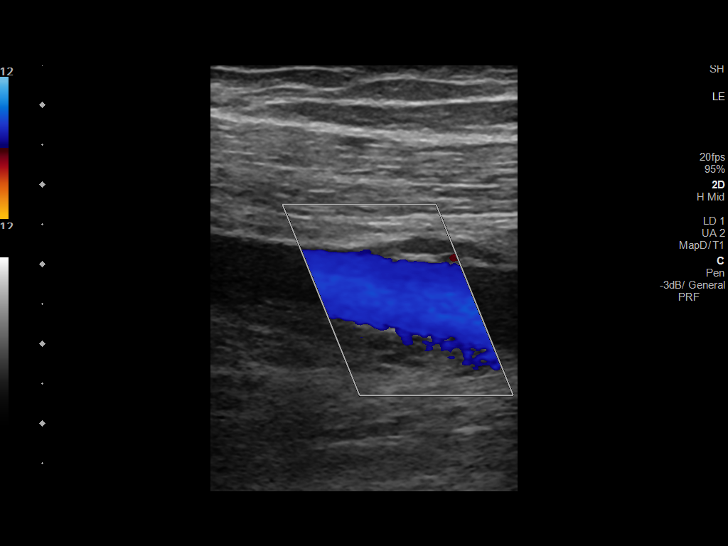
[im 19/36]
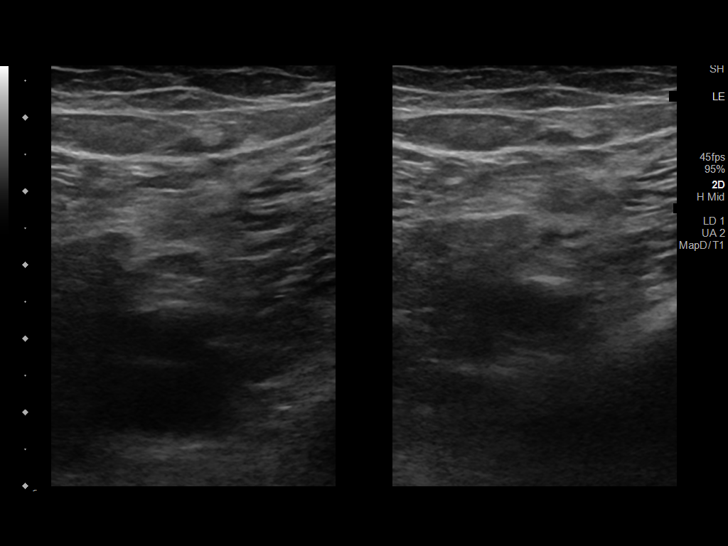
[im 22/36]
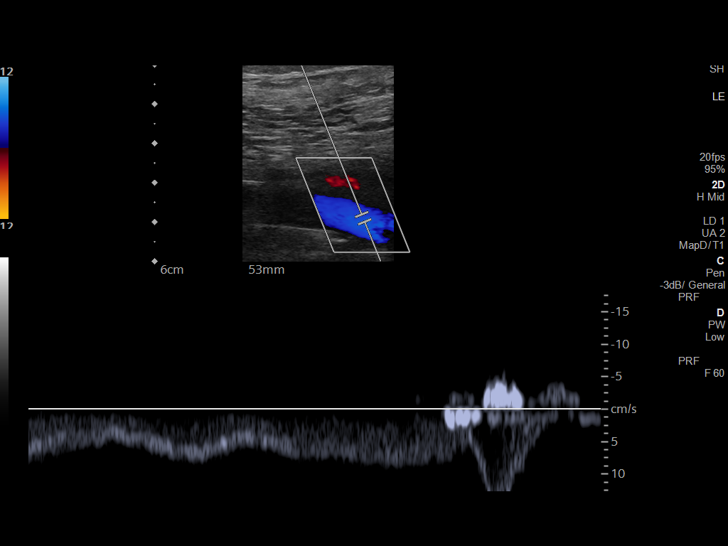
[im 25/36]
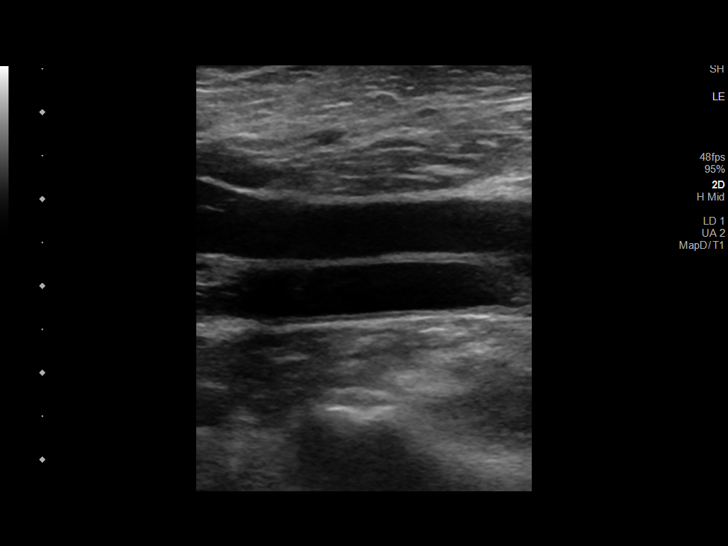
[im 28/36]
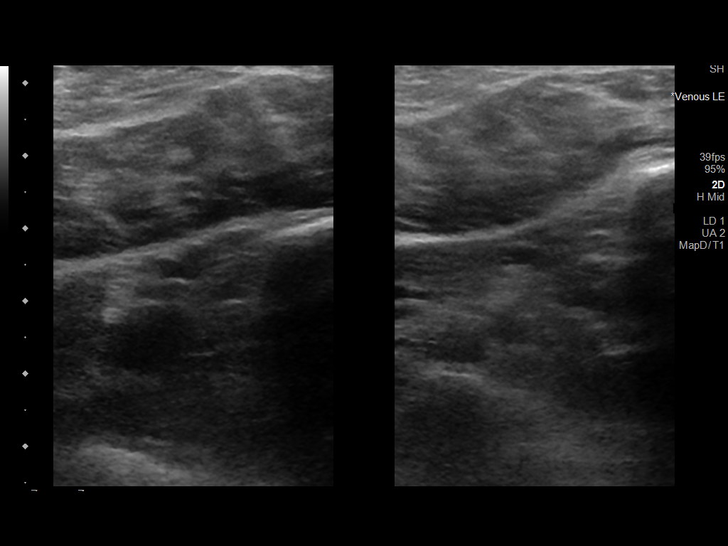
[im 29/36]
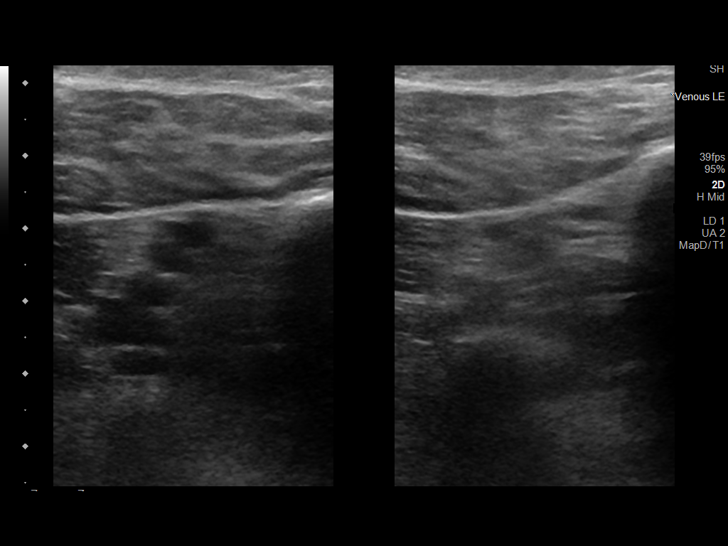
[im 32/36]
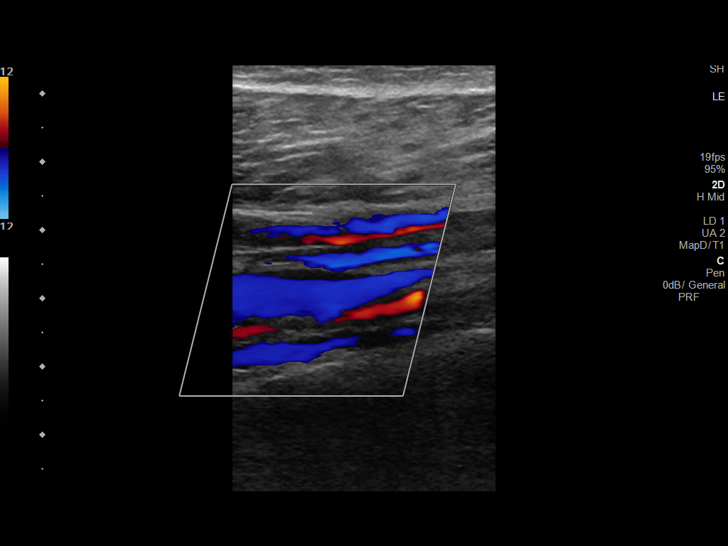
[im 36/36]
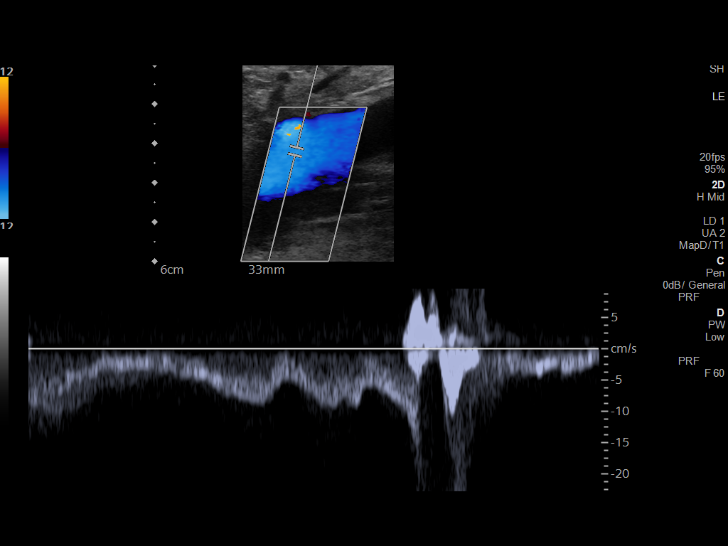

[14 of 24 positions shown; findings below may reference images not displayed]

FINDINGS: VENOUS

Normal compressibility of the LEFT common femoral, superficial
femoral, and popliteal veins, as well as the visualized LEFT calf
veins. Visualized portions of the LEFT profunda femoral vein and
LEFT great saphenous vein unremarkable. No filling defects to
suggest DVT on grayscale or color Doppler imaging. Doppler waveforms
show normal direction of venous flow, normal respiratory plasticity
and response to augmentation.

Limited views of the contralateral common femoral vein are
unremarkable.

OTHER

Multiple left groin lymph nodes are seen.

Limitations: none
IMPRESSION: No evidence of LEFT lower extremity DVT.
